# Patient Record
Sex: Female | Born: 2000 | Hispanic: Yes | Marital: Single | Smoking: Never smoker
Health system: Southern US, Community
[De-identification: ages and names within clinical notes are randomized; demographics above are authoritative.]

## PROBLEM LIST (undated history)

## (undated) HISTORY — PX: CHOLECYSTECTOMY: SHX55

---
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
## 2021-10-31 ENCOUNTER — Other Ambulatory Visit: Payer: Self-pay | Admitting: Family Medicine

## 2021-10-31 DIAGNOSIS — N939 Abnormal uterine and vaginal bleeding, unspecified: Secondary | ICD-10-CM

## 2021-11-16 ENCOUNTER — Ambulatory Visit: Payer: Self-pay | Attending: Family Medicine

## 2022-09-21 DIAGNOSIS — Z419 Encounter for procedure for purposes other than remedying health state, unspecified: Secondary | ICD-10-CM | POA: Diagnosis not present

## 2022-10-22 DIAGNOSIS — Z419 Encounter for procedure for purposes other than remedying health state, unspecified: Secondary | ICD-10-CM | POA: Diagnosis not present

## 2022-11-22 DIAGNOSIS — Z419 Encounter for procedure for purposes other than remedying health state, unspecified: Secondary | ICD-10-CM | POA: Diagnosis not present

## 2022-12-21 DIAGNOSIS — Z419 Encounter for procedure for purposes other than remedying health state, unspecified: Secondary | ICD-10-CM | POA: Diagnosis not present

## 2023-01-08 ENCOUNTER — Other Ambulatory Visit: Payer: Self-pay | Admitting: Family Medicine

## 2023-01-08 DIAGNOSIS — R1011 Right upper quadrant pain: Secondary | ICD-10-CM

## 2023-01-08 DIAGNOSIS — R519 Headache, unspecified: Secondary | ICD-10-CM

## 2023-01-08 DIAGNOSIS — R110 Nausea: Secondary | ICD-10-CM

## 2023-01-08 DIAGNOSIS — R197 Diarrhea, unspecified: Secondary | ICD-10-CM

## 2023-01-09 ENCOUNTER — Ambulatory Visit
Admission: RE | Admit: 2023-01-09 | Discharge: 2023-01-09 | Disposition: A | Discharge status: Home / Self Care | Payer: Managed Care, Other (non HMO) | Source: Ambulatory Visit | Attending: Family Medicine | Admitting: Family Medicine

## 2023-01-09 DIAGNOSIS — R197 Diarrhea, unspecified: Secondary | ICD-10-CM | POA: Insufficient documentation

## 2023-01-09 DIAGNOSIS — R110 Nausea: Secondary | ICD-10-CM | POA: Diagnosis present

## 2023-01-09 DIAGNOSIS — R1011 Right upper quadrant pain: Secondary | ICD-10-CM | POA: Diagnosis present

## 2023-01-16 ENCOUNTER — Inpatient Hospital Stay: Admission: RE | Admit: 2023-01-16 | Payer: Medicaid Other | Source: Ambulatory Visit

## 2023-01-22 ENCOUNTER — Other Ambulatory Visit: Payer: Medicaid Other

## 2023-05-31 ENCOUNTER — Other Ambulatory Visit: Payer: Self-pay

## 2023-05-31 MED ORDER — PANTOPRAZOLE SODIUM 40 MG PO TBEC
40.0000 mg | DELAYED_RELEASE_TABLET | Freq: Every day | ORAL | 11 refills | Status: DC
  Filled 2023-05-31 (×3): qty 30, 30d supply, fill #0

## 2023-05-31 MED ORDER — HYOSCYAMINE SULFATE 0.125 MG SL SUBL
0.1250 mg | SUBLINGUAL_TABLET | Freq: Four times a day (QID) | SUBLINGUAL | 1 refills | Status: DC | PRN
  Filled 2023-05-31: qty 30, 8d supply, fill #0

## 2023-06-02 ENCOUNTER — Other Ambulatory Visit: Payer: Self-pay

## 2023-06-11 ENCOUNTER — Encounter: Payer: Self-pay | Admitting: Gastroenterology

## 2023-06-11 ENCOUNTER — Other Ambulatory Visit: Payer: Self-pay | Admitting: Gastroenterology

## 2023-06-11 ENCOUNTER — Ambulatory Visit
Admission: RE | Admit: 2023-06-11 | Discharge: 2023-06-11 | Disposition: A | Discharge status: Home / Self Care | Payer: Managed Care, Other (non HMO) | Source: Ambulatory Visit | Attending: Gastroenterology | Admitting: Gastroenterology

## 2023-06-11 DIAGNOSIS — R1084 Generalized abdominal pain: Secondary | ICD-10-CM | POA: Insufficient documentation

## 2023-06-11 MED ORDER — IOHEXOL 300 MG/ML  SOLN
100.0000 mL | Freq: Once | INTRAMUSCULAR | Status: AC | PRN
  Administered 2023-06-11: 100 mL via INTRAVENOUS

## 2023-06-13 ENCOUNTER — Other Ambulatory Visit: Payer: Self-pay | Admitting: Family Medicine

## 2023-06-13 DIAGNOSIS — R110 Nausea: Secondary | ICD-10-CM

## 2023-06-13 DIAGNOSIS — R112 Nausea with vomiting, unspecified: Secondary | ICD-10-CM

## 2023-06-13 DIAGNOSIS — R197 Diarrhea, unspecified: Secondary | ICD-10-CM

## 2023-06-13 DIAGNOSIS — R1013 Epigastric pain: Secondary | ICD-10-CM

## 2023-06-18 ENCOUNTER — Other Ambulatory Visit: Payer: Self-pay

## 2023-06-18 MED ORDER — ZEPBOUND 5 MG/0.5ML ~~LOC~~ SOAJ
5.0000 mg | SUBCUTANEOUS | 3 refills | Status: DC
Start: 2023-06-18 — End: 2023-07-11
  Filled 2023-06-18 – 2023-06-25 (×3): qty 2, 28d supply, fill #0

## 2023-06-19 ENCOUNTER — Other Ambulatory Visit: Payer: Self-pay

## 2023-06-19 MED ORDER — AZITHROMYCIN 250 MG PO TABS
ORAL_TABLET | ORAL | 0 refills | Status: DC
  Filled 2023-06-19: qty 6, 5d supply, fill #0

## 2023-06-19 MED ORDER — PAXLOVID (300/100) 20 X 150 MG & 10 X 100MG PO TBPK
ORAL_TABLET | ORAL | 0 refills | Status: DC
  Filled 2023-06-19: qty 30, 5d supply, fill #0
  Filled 2023-06-19: qty 30, 30d supply, fill #0
  Filled 2023-06-19: qty 30, 5d supply, fill #0

## 2023-06-19 MED ORDER — PREDNISONE 20 MG PO TABS
20.0000 mg | ORAL_TABLET | Freq: Two times a day (BID) | ORAL | 0 refills | Status: DC
  Filled 2023-06-19: qty 10, 5d supply, fill #0

## 2023-06-25 ENCOUNTER — Other Ambulatory Visit: Payer: Self-pay

## 2023-08-09 ENCOUNTER — Encounter: Payer: Self-pay | Admitting: Emergency Medicine

## 2023-08-09 ENCOUNTER — Ambulatory Visit
Admission: EM | Admit: 2023-08-09 | Discharge: 2023-08-09 | Disposition: A | Discharge status: Home / Self Care | Payer: Managed Care, Other (non HMO) | Attending: Internal Medicine | Admitting: Internal Medicine

## 2023-08-09 DIAGNOSIS — N898 Other specified noninflammatory disorders of vagina: Secondary | ICD-10-CM

## 2023-08-09 DIAGNOSIS — T50Z95A Adverse effect of other vaccines and biological substances, initial encounter: Secondary | ICD-10-CM | POA: Diagnosis present

## 2023-08-09 DIAGNOSIS — Z1152 Encounter for screening for COVID-19: Secondary | ICD-10-CM | POA: Diagnosis not present

## 2023-08-09 DIAGNOSIS — R52 Pain, unspecified: Secondary | ICD-10-CM | POA: Insufficient documentation

## 2023-08-09 LAB — URINALYSIS, W/ REFLEX TO CULTURE (INFECTION SUSPECTED)
Bilirubin Urine: NEGATIVE
Glucose, UA: NEGATIVE mg/dL
Hgb urine dipstick: NEGATIVE
Ketones, ur: NEGATIVE mg/dL
Leukocytes,Ua: NEGATIVE
Nitrite: NEGATIVE
Protein, ur: NEGATIVE mg/dL
RBC / HPF: NONE SEEN RBC/hpf (ref 0–5)
Specific Gravity, Urine: 1.025 (ref 1.005–1.030)
WBC, UA: NONE SEEN WBC/hpf (ref 0–5)
pH: 6 (ref 5.0–8.0)

## 2023-08-09 LAB — RESP PANEL BY RT-PCR (FLU A&B, COVID) ARPGX2
Influenza A by PCR: NEGATIVE
Influenza B by PCR: NEGATIVE
SARS Coronavirus 2 by RT PCR: NEGATIVE

## 2023-08-09 LAB — WET PREP, GENITAL
Clue Cells Wet Prep HPF POC: NONE SEEN
Sperm: NONE SEEN
Trich, Wet Prep: NONE SEEN
WBC, Wet Prep HPF POC: >10 — AB (ref <10)
Yeast Wet Prep HPF POC: NONE SEEN

## 2023-08-09 NOTE — Discharge Instructions (Signed)
Please follow-up with your gynecologist for further workup of your symptoms.  You may take Tylenol or ibuprofen as needed for body aches.  Lots of rest and fluids.  Follow-up with your PCP if your symptoms do not improve.  Please go to the ER for any worsening symptoms.  I hope you feel better soon!

## 2023-08-09 NOTE — ED Provider Notes (Signed)
[facility]    CSN: [id number] Arrival date & time: 08/09/23  0827      History   Chief Complaint Chief Complaint  Patient presents with   Generalized Body Aches   Chills   Vaginal Discharge    HPI Kathleen Mack is a 22 y.o. female  presents for evaluation of URI symptoms for 1 days. Patient reports associated symptoms of headache, body aches, chills. Denies N/V/D, fevers, ear pain, cough, sore throat, shortness of breath. Patient does not have a hx of asthma. Patient does not have a history of smoking.  Reports no sick contacts.  Symptoms began after getting the flu shot yesterday.  She states she is never had this reaction to flu shot in the past.  In addition she reports 1 week of a vaginal discharge that is neither malodorous nor pruritic.  States she has had this type of discharge in the past and was told it was BV but denies any other symptoms.  No dysuria, flank pain, STD concern or exposure.  Pt has taken Tylenol OTC for symptoms. Pt has no other concerns at this time.    Vaginal Discharge   No past medical history on file.  There are no problems to display for this patient.   No past surgical history on file.  OB History   No obstetric history on file.      Home Medications    Prior to Admission medications   Medication Sig Start Date End Date Taking? Authorizing Provider  azithromycin (ZITHROMAX) 250 MG tablet Take 2 tablets (500mg ) by mouth on Day 1, then take 1 tablet (250mg ) by mouth on Days 2-5. 06/19/23     hyoscyamine (LEVSIN SL) 0.125 MG SL tablet Place 1 tablet (0.125 mg total) under the tongue every 6 (six) hours as needed for cramping. 05/31/23     nirmatrelvir & ritonavir (PAXLOVID, 300/100,) 20 x 150 MG & 10 x 100MG  TBPK Take 2 (two) pink tablets (300 mg nirmatrelvir) with 1 (one) tablet (100 mg ritonavir) by mouth twice a day for 5 days. All 3 (three) tablets to be taken together every morning and evening. 06/19/23     ondansetron (ZOFRAN  ODT) 4 MG disintegrating tablet Take 1 tablet (4 mg total) by mouth every 8 (eight) hours as needed for nausea or vomiting. 08/10/15   Minna Antis, MD  pantoprazole (PROTONIX) 40 MG tablet Take 1 tablet (40 mg total) by mouth daily. 05/31/23     phenazopyridine (PYRIDIUM) 200 MG tablet Take 1 tablet (200 mg total) by mouth 3 (three) times daily as needed for pain. 05/24/17   Joni Reining, PA-C  predniSONE (DELTASONE) 20 MG tablet Take 1 tablet (20 mg total) by mouth 2 (two) times daily for 5 days. 06/19/23     ranitidine (ZANTAC) 150 MG tablet Take 150 mg by mouth daily. For 14 days    [provider]    Family History No family history on file.  Social History Social History   Tobacco Use   Smoking status: Never   Smokeless tobacco: Never  Vaping Use   Vaping status: Never Used  Substance Use Topics   Alcohol use: No     Allergies   Patient has no known allergies.   Review of Systems Review of Systems  Constitutional:  Positive for chills.  Genitourinary:  Positive for vaginal discharge.  Musculoskeletal:  Positive for myalgias.  Neurological:  Positive for headaches.     Physical Exam Triage Vital Signs  ED Triage Vitals [08/09/23 0907]  Encounter Vitals Group     BP 121/67     Systolic BP Percentile      Diastolic BP Percentile      Pulse Rate 99     Resp 14     Temp 98.1 F (36.7 C)     Temp Source Oral     SpO2 100 %     Weight      Height      Head Circumference      Peak Flow      Pain Score      Pain Loc      Pain Education      Exclude from Growth Chart    No data found.  Updated Vital Signs BP 121/67 (BP Location: Left Arm)   Pulse 99   Temp 98.1 F (36.7 C) (Oral)   Resp 14   LMP 08/01/2023 (Exact Date)   SpO2 100%   Visual Acuity Right Eye Distance:   Left Eye Distance:   Bilateral Distance:    Right Eye Near:   Left Eye Near:    Bilateral Near:     Physical Exam Vitals and nursing note reviewed.  Constitutional:       General: She is not in acute distress.    Appearance: She is well-developed. She is not ill-appearing.  HENT:     Head: Normocephalic and atraumatic.     Right Ear: Tympanic membrane and ear canal normal.     Left Ear: Tympanic membrane and ear canal normal.     Nose: No congestion or rhinorrhea.     Mouth/Throat:     Mouth: Mucous membranes are moist.     Pharynx: Oropharynx is clear. Uvula midline. No posterior oropharyngeal erythema.     Tonsils: No tonsillar exudate or tonsillar abscesses.  Eyes:     Conjunctiva/sclera: Conjunctivae normal.     Pupils: Pupils are equal, round, and reactive to light.  Cardiovascular:     Rate and Rhythm: Normal rate and regular rhythm.     Heart sounds: Normal heart sounds.  Pulmonary:     Effort: Pulmonary effort is normal.     Breath sounds: Normal breath sounds.  Musculoskeletal:     Cervical back: Normal range of motion and neck supple.  Lymphadenopathy:     Cervical: No cervical adenopathy.  Skin:    General: Skin is warm and dry.  Neurological:     General: No focal deficit present.     Mental Status: She is alert and oriented to person, place, and time.  Psychiatric:        Mood and Affect: Mood normal.        Behavior: Behavior normal.      UC Treatments / Results  Labs (all labs ordered are listed, but only abnormal results are displayed) Labs Reviewed  WET PREP, GENITAL - Abnormal; Notable for the following components:      Result Value   WBC, Wet Prep HPF POC >10 (*)    All other components within normal limits  URINALYSIS, W/ REFLEX TO CULTURE (INFECTION SUSPECTED) - Abnormal; Notable for the following components:   Bacteria, UA RARE (*)    All other components within normal limits  RESP PANEL BY RT-PCR (FLU A&B, COVID) [id number]    EKG   Radiology No results found.  Procedures Procedures (including critical care time)  Medications Ordered in UC Medications - No data to display  Initial Impression /  Assessment  and Plan / UC Course  I have reviewed the triage vital signs and the nursing notes.  Pertinent labs & imaging results that were available during my care of the patient were reviewed by me and considered in my medical decision making (see chart for details).     Reviewed exam and symptoms with patient.  No red flags.  UA negative for UTI.  Wet prep negative for BV, yeast, trichomonas.  Patient declines any STD testing at this time.  States vaginal discharge just more than normal but otherwise not bothersome.  Advised to follow-up with her gynecologist if symptoms persist.  Discussed body aches and chills are secondary to her immune response to the vaccine.  She has a negative COVID and flu PCR.  Advised OTC analgesics and rest for this.  PCP follow-up as symptoms do not improve.  ER precautions reviewed. Final Clinical Impressions(s) / UC Diagnoses   Final diagnoses:  Body aches after vaccination  Vaginal discharge     Discharge Instructions      Please follow-up with your gynecologist for further workup of your symptoms.  You may take Tylenol or ibuprofen as needed for body aches.  Lots of rest and fluids.  Follow-up with your PCP if your symptoms do not improve.  Please go to the ER for any worsening symptoms.  I hope you feel better soon!     ED Prescriptions   None    PDMP not reviewed this encounter.   Radford Pax, NP 08/09/23 1015

## 2023-08-09 NOTE — ED Triage Notes (Addendum)
Patient states that she got her flu vaccine yesterday.  Patient states that last night she started having bodyaches, nausea and chills.  Patient denies fevers.  Patient also reports vaginal discharge for a week.  Patient has history of BV.  Patient would like a work note

## 2023-08-12 NOTE — Plan of Care (Signed)
[facility] Tonsillectomy/Adenoidectomy, Postoperative PEDS care plan entered in error.

## 2023-08-23 ENCOUNTER — Other Ambulatory Visit: Payer: Self-pay

## 2023-08-23 MED ORDER — ESCITALOPRAM OXALATE 5 MG PO TABS
5.0000 mg | ORAL_TABLET | Freq: Every day | ORAL | 0 refills | Status: DC
  Filled 2023-08-23: qty 30, 30d supply, fill #0

## 2023-09-09 ENCOUNTER — Other Ambulatory Visit: Payer: Self-pay

## 2023-09-09 MED ORDER — PROMETHAZINE HCL 12.5 MG PO TABS
12.5000 mg | ORAL_TABLET | Freq: Three times a day (TID) | ORAL | 0 refills | Status: DC | PRN
  Filled 2023-09-09: qty 20, 7d supply, fill #0

## 2023-09-09 MED ORDER — CEFDINIR 300 MG PO CAPS
300.0000 mg | ORAL_CAPSULE | Freq: Two times a day (BID) | ORAL | 0 refills | Status: DC
  Filled 2023-09-09: qty 14, 7d supply, fill #0

## 2023-09-09 MED ORDER — OMEPRAZOLE 20 MG PO CPDR
20.0000 mg | DELAYED_RELEASE_CAPSULE | Freq: Every day | ORAL | 0 refills | Status: AC
  Filled 2023-09-09: qty 14, 14d supply, fill #0

## 2023-09-11 ENCOUNTER — Encounter: Payer: Self-pay | Admitting: Family Medicine

## 2023-09-13 ENCOUNTER — Other Ambulatory Visit: Payer: Self-pay

## 2023-09-13 MED ORDER — PHENTERMINE HCL 37.5 MG PO TABS
37.5000 mg | ORAL_TABLET | Freq: Every morning | ORAL | 2 refills | Status: DC
  Filled 2023-09-13: qty 30, 30d supply, fill #0
  Filled 2023-10-11: qty 30, 30d supply, fill #1
  Filled 2023-11-12: qty 30, 30d supply, fill #2

## 2023-09-13 MED ORDER — NORETHIN ACE-ETH ESTRAD-FE 1-20 MG-MCG PO TABS
1.0000 | ORAL_TABLET | Freq: Every day | ORAL | 3 refills | Status: DC
  Filled 2023-09-13: qty 84, 84d supply, fill #0

## 2023-09-17 ENCOUNTER — Ambulatory Visit
Admission: RE | Admit: 2023-09-17 | Discharge: 2023-09-17 | Disposition: A | Discharge status: Home / Self Care | Payer: Managed Care, Other (non HMO) | Source: Ambulatory Visit | Attending: Family Medicine | Admitting: Family Medicine

## 2023-09-17 DIAGNOSIS — R110 Nausea: Secondary | ICD-10-CM

## 2023-09-17 DIAGNOSIS — R519 Headache, unspecified: Secondary | ICD-10-CM

## 2023-09-17 MED ORDER — GADOPICLENOL 0.5 MMOL/ML IV SOLN
10.0000 mL | Freq: Once | INTRAVENOUS | Status: AC | PRN
  Administered 2023-09-17: 9 mL via INTRAVENOUS

## 2023-09-23 ENCOUNTER — Other Ambulatory Visit: Payer: Self-pay

## 2023-09-23 MED ORDER — ESCITALOPRAM OXALATE 5 MG PO TABS
5.0000 mg | ORAL_TABLET | Freq: Every day | ORAL | 1 refills | Status: DC
  Filled 2023-09-23: qty 90, 90d supply, fill #0

## 2023-09-24 ENCOUNTER — Other Ambulatory Visit: Payer: Self-pay

## 2023-10-08 ENCOUNTER — Other Ambulatory Visit: Payer: Self-pay

## 2023-10-08 MED ORDER — NORGESTIMATE-ETH ESTRADIOL 0.25-35 MG-MCG PO TABS
1.0000 | ORAL_TABLET | Freq: Every day | ORAL | 3 refills | Status: DC
  Filled 2023-10-08: qty 84, 84d supply, fill #0

## 2023-10-10 ENCOUNTER — Other Ambulatory Visit: Payer: Self-pay

## 2023-10-10 MED ORDER — OSELTAMIVIR PHOSPHATE 75 MG PO CAPS
75.0000 mg | ORAL_CAPSULE | Freq: Two times a day (BID) | ORAL | 0 refills | Status: DC
  Filled 2023-10-10: qty 10, 5d supply, fill #0

## 2023-10-11 ENCOUNTER — Other Ambulatory Visit: Payer: Self-pay

## 2023-11-11 ENCOUNTER — Ambulatory Visit
Admission: EM | Admit: 2023-11-11 | Discharge: 2023-11-11 | Disposition: A | Discharge status: Home / Self Care | Payer: Managed Care, Other (non HMO) | Attending: Emergency Medicine | Admitting: Emergency Medicine

## 2023-11-11 DIAGNOSIS — R300 Dysuria: Secondary | ICD-10-CM | POA: Insufficient documentation

## 2023-11-11 DIAGNOSIS — N898 Other specified noninflammatory disorders of vagina: Secondary | ICD-10-CM | POA: Insufficient documentation

## 2023-11-11 DIAGNOSIS — R8290 Unspecified abnormal findings in urine: Secondary | ICD-10-CM | POA: Diagnosis present

## 2023-11-11 LAB — POCT URINALYSIS DIP (MANUAL ENTRY)
Bilirubin, UA: NEGATIVE
Blood, UA: NEGATIVE
Glucose, UA: NEGATIVE mg/dL
Ketones, POC UA: NEGATIVE mg/dL
Nitrite, UA: NEGATIVE
Protein Ur, POC: NEGATIVE mg/dL
Spec Grav, UA: 1.02 (ref 1.010–1.025)
Urobilinogen, UA: >=8 U/dL — AB
pH, UA: 7 (ref 5.0–8.0)

## 2023-11-11 LAB — POCT URINE PREGNANCY: Preg Test, Ur: NEGATIVE

## 2023-11-11 MED ORDER — METRONIDAZOLE 500 MG PO TABS: 500.0000 mg | ORAL_TABLET | Freq: Two times a day (BID) | ORAL | 0 refills | Status: AC

## 2023-11-11 NOTE — ED Triage Notes (Signed)
Patient to Urgent Care with complaints of vaginal discharge/ dysuria/ dark urine.   Symptoms started yesterday.  Denies any concerns for STDs but would like to rule this out.

## 2023-11-11 NOTE — Discharge Instructions (Addendum)
Your urine is positive for urobilinogen.  Please have this rechecked by your primary care provider.    Your vaginal tests are pending.  If your test results are positive, we will call you.  You and your sexual partner(s) may require treatment at that time.  Do not have sexual activity for at least 7 days.    Take the metronidazole as directed.  Stop this medication if your vaginal tests are negative for bacterial vaginitis.

## 2023-11-11 NOTE — ED Provider Notes (Signed)
[facility]    CSN: [id number] Arrival date & time: 11/11/23  1747      History   Chief Complaint Chief Complaint  Patient presents with   Dysuria   Vaginal Discharge    HPI Kathleen Mack is a 23 y.o. female.  Patient presents with dysuria since yesterday.  She states her urine has been dark for a couple of weeks.  Patient also presents with yellow vaginal discharge x 1 day.  She reports history of bacterial vaginitis with similar symptoms.  She requests STD testing as well as testing for BV and yeast.  No OTC medications taken.  No abdominal pain, hematuria, flank pain, pelvic pain.  The history is provided by the patient and medical records.    History reviewed. No pertinent past medical history.  There are no active problems to display for this patient.   Past Surgical History:  Procedure Laterality Date   CHOLECYSTECTOMY      OB History   No obstetric history on file.      Home Medications    Prior to Admission medications   Medication Sig Start Date End Date Taking? Authorizing Provider  metroNIDAZOLE (FLAGYL) 500 MG tablet Take 1 tablet (500 mg total) by mouth 2 (two) times daily. 11/11/23  Yes Mickie Bail, NP  escitalopram (LEXAPRO) 5 MG tablet Take 1 tablet (5 mg total) by mouth daily. Patient not taking: Reported on 11/11/2023 09/23/23     norgestimate-ethinyl estradiol (ORTHO-CYCLEN) 0.25-35 MG-MCG tablet Take 1 tablet by mouth daily. 10/08/23     omeprazole (PRILOSEC) 20 MG capsule Take 1 capsule (20 mg total) by mouth daily for 14 days. 09/09/23     ondansetron (ZOFRAN ODT) 4 MG disintegrating tablet Take 1 tablet (4 mg total) by mouth every 8 (eight) hours as needed for nausea or vomiting. 08/10/15   Minna Antis, MD  phentermine (ADIPEX-P) 37.5 MG tablet Take 1 tablet (37.5 mg total) by mouth every morning before breakfast 09/13/23     ranitidine (ZANTAC) 150 MG tablet Take 150 mg by mouth daily. For 14 days    [provider]   hyoscyamine (LEVSIN SL) 0.125 MG SL tablet Place 1 tablet (0.125 mg total) under the tongue every 6 (six) hours as needed for cramping. 05/31/23 08/23/23    norethindrone-ethinyl estradiol-FE (LOESTRIN FE) 1-20 MG-MCG tablet Take 1 tablet by mouth daily. 09/13/23 10/08/23    pantoprazole (PROTONIX) 40 MG tablet Take 1 tablet (40 mg total) by mouth daily. 05/31/23 08/23/23    promethazine (PHENERGAN) 12.5 MG tablet Take 1 tablet (12.5 mg total) by mouth every 8 (eight) hours as needed for nausea. 09/09/23 09/23/23      Family History History reviewed. No pertinent family history.  Social History Social History   Tobacco Use   Smoking status: Never   Smokeless tobacco: Never  Vaping Use   Vaping status: Never Used  Substance Use Topics   Alcohol use: No   Drug use: Never     Allergies   Patient has no known allergies.   Review of Systems Review of Systems  Constitutional:  Negative for chills and fever.  Gastrointestinal:  Negative for abdominal pain, diarrhea, nausea and vomiting.  Genitourinary:  Positive for dysuria and vaginal discharge. Negative for flank pain, hematuria and pelvic pain.     Physical Exam Triage Vital Signs ED Triage Vitals  Encounter Vitals Group     BP      Systolic BP Percentile  Diastolic BP Percentile      Pulse      Resp      Temp      Temp src      SpO2      Weight      Height      Head Circumference      Peak Flow      Pain Score      Pain Loc      Pain Education      Exclude from Growth Chart    No data found.  Updated Vital Signs BP 106/69   Pulse 90   Temp 98.5 F (36.9 C)   Resp 18   SpO2 98%   Visual Acuity Right Eye Distance:   Left Eye Distance:   Bilateral Distance:    Right Eye Near:   Left Eye Near:    Bilateral Near:     Physical Exam Constitutional:      General: She is not in acute distress. HENT:     Mouth/Throat:     Mouth: Mucous membranes are moist.  Cardiovascular:     Rate and Rhythm:  Normal rate and regular rhythm.  Pulmonary:     Effort: Pulmonary effort is normal. No respiratory distress.  Abdominal:     General: Bowel sounds are normal.     Palpations: Abdomen is soft.     Tenderness: There is no abdominal tenderness. There is no right CVA tenderness, left CVA tenderness, guarding or rebound.  Neurological:     Mental Status: She is alert.      UC Treatments / Results  Labs (all labs ordered are listed, but only abnormal results are displayed) Labs Reviewed  POCT URINALYSIS DIP (MANUAL ENTRY) - Abnormal; Notable for the following components:      Result Value   Urobilinogen, UA >=8.0 (*)    Leukocytes, UA Trace (*)    All other components within normal limits  POCT URINE PREGNANCY  CERVICOVAGINAL ANCILLARY ONLY    EKG   Radiology No results found.  Procedures Procedures (including critical care time)  Medications Ordered in UC Medications - No data to display  Initial Impression / Assessment and Plan / UC Course  I have reviewed the triage vital signs and the nursing notes.  Pertinent labs & imaging results that were available during my care of the patient were reviewed by me and considered in my medical decision making (see chart for details).   Vaginal discharge, dysuria, abnormal urine finding.  Discussed with patient that her urine is positive for urobilinogen and that this needs to be rechecked by her PCP.  Patient obtained vaginal self swab for testing.  Treating with metronidazole.  Discussed that we will call if test results show the need to change the treatment.  Instructed patient to abstain from sexual activity for at least 7 days.  Instructed her to follow-up with her PCP.  Patient agrees to plan of care.    Final Clinical Impressions(s) / UC Diagnoses   Final diagnoses:  Vaginal discharge  Dysuria  Abnormal urine finding     Discharge Instructions      Your urine is positive for urobilinogen.  Please have this rechecked by  your primary care provider.    Your vaginal tests are pending.  If your test results are positive, we will call you.  You and your sexual partner(s) may require treatment at that time.  Do not have sexual activity for at least 7 days.  Take the metronidazole as directed.  Stop this medication if your vaginal tests are negative for bacterial vaginitis.            ED Prescriptions     Medication Sig Dispense Auth. Provider   metroNIDAZOLE (FLAGYL) 500 MG tablet Take 1 tablet (500 mg total) by mouth 2 (two) times daily. 14 tablet Mickie Bail, NP      PDMP not reviewed this encounter.   Mickie Bail, NP 11/11/23 [phone]

## 2023-11-12 ENCOUNTER — Other Ambulatory Visit: Payer: Self-pay

## 2023-11-12 LAB — CERVICOVAGINAL ANCILLARY ONLY
Bacterial Vaginitis (gardnerella): POSITIVE — AB
Candida Glabrata: NEGATIVE
Candida Vaginitis: NEGATIVE
Chlamydia: NEGATIVE
Comment: NEGATIVE
Comment: NEGATIVE
Comment: NEGATIVE
Comment: NEGATIVE
Comment: NEGATIVE
Comment: NORMAL
Neisseria Gonorrhea: NEGATIVE
Trichomonas: NEGATIVE

## 2023-11-28 ENCOUNTER — Other Ambulatory Visit: Payer: Self-pay

## 2023-11-28 MED ORDER — LINZESS 72 MCG PO CAPS
72.0000 ug | ORAL_CAPSULE | Freq: Every day | ORAL | 3 refills | Status: DC
  Filled 2023-11-28: qty 30, 30d supply, fill #0

## 2023-12-02 ENCOUNTER — Other Ambulatory Visit: Payer: Self-pay

## 2023-12-02 MED ORDER — CYCLOBENZAPRINE HCL 5 MG PO TABS
5.0000 mg | ORAL_TABLET | Freq: Three times a day (TID) | ORAL | 0 refills | Status: AC | PRN
  Filled 2023-12-02: qty 21, 7d supply, fill #0

## 2023-12-13 ENCOUNTER — Other Ambulatory Visit: Payer: Self-pay

## 2023-12-13 MED ORDER — PHENTERMINE HCL 37.5 MG PO TABS
37.5000 mg | ORAL_TABLET | Freq: Every day | ORAL | 0 refills | Status: DC
  Filled 2023-12-13: qty 30, 30d supply, fill #0

## 2023-12-13 MED ORDER — MEDROXYPROGESTERONE ACETATE 10 MG PO TABS
10.0000 mg | ORAL_TABLET | Freq: Every day | ORAL | 0 refills | Status: DC
  Filled 2023-12-13: qty 10, 10d supply, fill #0

## 2023-12-24 ENCOUNTER — Other Ambulatory Visit: Payer: Self-pay

## 2023-12-24 MED ORDER — AMPHETAMINE-DEXTROAMPHETAMINE 5 MG PO TABS
5.0000 mg | ORAL_TABLET | Freq: Every morning | ORAL | 0 refills | Status: AC
  Filled 2023-12-24: qty 60, 20d supply, fill #0

## 2024-01-16 ENCOUNTER — Other Ambulatory Visit: Payer: Self-pay

## 2024-01-16 MED ORDER — AMPHETAMINE-DEXTROAMPHETAMINE 10 MG PO TABS
10.0000 mg | ORAL_TABLET | Freq: Every morning | ORAL | 0 refills | Status: AC
  Filled 2024-01-16: qty 60, 20d supply, fill #0

## 2024-01-23 ENCOUNTER — Other Ambulatory Visit: Payer: Self-pay

## 2024-01-23 MED ORDER — IBUPROFEN 800 MG PO TABS
800.0000 mg | ORAL_TABLET | Freq: Four times a day (QID) | ORAL | 0 refills | Status: AC | PRN
  Filled 2024-01-23: qty 30, 8d supply, fill #0

## 2024-02-06 ENCOUNTER — Other Ambulatory Visit: Payer: Self-pay

## 2024-02-06 MED ORDER — BUPROPION HCL 75 MG PO TABS
ORAL_TABLET | ORAL | 2 refills | Status: AC
Start: 2024-02-06 — End: 2024-07-20
  Filled 2024-02-06: qty 55, 32d supply, fill #0

## 2024-02-18 ENCOUNTER — Other Ambulatory Visit: Payer: Self-pay

## 2024-02-18 MED ORDER — AMPHETAMINE-DEXTROAMPHETAMINE 20 MG PO TABS
20.0000 mg | ORAL_TABLET | Freq: Every morning | ORAL | 0 refills | Status: DC
  Filled 2024-02-18: qty 30, 30d supply, fill #0

## 2024-02-26 ENCOUNTER — Other Ambulatory Visit: Payer: Self-pay

## 2024-02-26 MED ORDER — PROPRANOLOL HCL 10 MG PO TABS
10.0000 mg | ORAL_TABLET | Freq: Two times a day (BID) | ORAL | 0 refills | Status: DC
  Filled 2024-02-26: qty 60, 30d supply, fill #0

## 2024-03-06 ENCOUNTER — Other Ambulatory Visit: Payer: Self-pay

## 2024-03-06 MED ORDER — ESCITALOPRAM OXALATE 5 MG PO TABS
ORAL_TABLET | ORAL | 2 refills | Status: DC
  Filled 2024-03-06: qty 60, 37d supply, fill #0

## 2024-03-06 MED ORDER — NORETHINDRONE 0.35 MG PO TABS
1.0000 | ORAL_TABLET | Freq: Every day | ORAL | 1 refills | Status: DC
Start: 2024-03-06 — End: 2024-05-04
  Filled 2024-03-06: qty 84, 84d supply, fill #0

## 2024-03-09 ENCOUNTER — Other Ambulatory Visit: Payer: Self-pay

## 2024-03-09 MED ORDER — LINZESS 145 MCG PO CAPS
145.0000 ug | ORAL_CAPSULE | Freq: Every day | ORAL | 3 refills | Status: DC
Start: 2024-03-09 — End: 2024-07-10
  Filled 2024-03-09: qty 30, 30d supply, fill #0

## 2024-03-19 ENCOUNTER — Other Ambulatory Visit: Payer: Self-pay

## 2024-03-19 MED ORDER — AMPHETAMINE-DEXTROAMPHETAMINE 20 MG PO TABS
20.0000 mg | ORAL_TABLET | Freq: Every morning | ORAL | 0 refills | Status: DC
  Filled 2024-03-19: qty 30, 30d supply, fill #0

## 2024-03-19 MED ORDER — FLUOXETINE HCL 10 MG PO CAPS
ORAL_CAPSULE | ORAL | 0 refills | Status: AC
  Filled 2024-03-19: qty 80, 32d supply, fill #0

## 2024-04-17 ENCOUNTER — Other Ambulatory Visit: Payer: Self-pay

## 2024-04-17 MED ORDER — NITROFURANTOIN MONOHYD MACRO 100 MG PO CAPS
100.0000 mg | ORAL_CAPSULE | Freq: Two times a day (BID) | ORAL | 0 refills | Status: AC
  Filled 2024-04-17: qty 10, 5d supply, fill #0

## 2024-04-21 ENCOUNTER — Other Ambulatory Visit: Payer: Self-pay

## 2024-04-21 MED ORDER — AMPHETAMINE-DEXTROAMPHETAMINE 20 MG PO TABS
20.0000 mg | ORAL_TABLET | Freq: Every morning | ORAL | 0 refills | Status: AC
  Filled 2024-04-21: qty 30, 30d supply, fill #0

## 2024-04-30 ENCOUNTER — Other Ambulatory Visit: Payer: Self-pay

## 2024-04-30 ENCOUNTER — Ambulatory Visit

## 2024-04-30 MED ORDER — ONDANSETRON 4 MG PO TBDP
4.0000 mg | ORAL_TABLET | Freq: Three times a day (TID) | ORAL | 0 refills | Status: DC | PRN
  Filled 2024-04-30: qty 20, 7d supply, fill #0

## 2024-04-30 MED ORDER — CEFDINIR 300 MG PO CAPS
300.0000 mg | ORAL_CAPSULE | Freq: Two times a day (BID) | ORAL | 0 refills | Status: AC
  Filled 2024-04-30: qty 20, 10d supply, fill #0

## 2024-05-05 ENCOUNTER — Other Ambulatory Visit: Payer: Self-pay

## 2024-05-05 MED ORDER — TERCONAZOLE 0.4 % VA CREA
1.0000 | TOPICAL_CREAM | Freq: Every day | VAGINAL | 0 refills | Status: DC
  Filled 2024-05-05: qty 45, 7d supply, fill #0

## 2024-06-20 ENCOUNTER — Other Ambulatory Visit: Payer: Self-pay

## 2024-06-20 ENCOUNTER — Emergency Department
Admission: EM | Admit: 2024-06-20 | Discharge: 2024-06-20 | Discharge status: Against Medical Advice | Attending: Emergency Medicine | Admitting: Emergency Medicine

## 2024-06-20 ENCOUNTER — Encounter: Payer: Self-pay | Admitting: Emergency Medicine

## 2024-06-20 DIAGNOSIS — Z5321 Procedure and treatment not carried out due to patient leaving prior to being seen by health care provider: Secondary | ICD-10-CM | POA: Diagnosis not present

## 2024-06-20 DIAGNOSIS — O26891 Other specified pregnancy related conditions, first trimester: Secondary | ICD-10-CM | POA: Diagnosis present

## 2024-06-20 DIAGNOSIS — R1030 Lower abdominal pain, unspecified: Secondary | ICD-10-CM | POA: Diagnosis not present

## 2024-06-20 DIAGNOSIS — Z3A10 10 weeks gestation of pregnancy: Secondary | ICD-10-CM | POA: Diagnosis not present

## 2024-06-20 DIAGNOSIS — R42 Dizziness and giddiness: Secondary | ICD-10-CM | POA: Diagnosis not present

## 2024-06-20 DIAGNOSIS — R0602 Shortness of breath: Secondary | ICD-10-CM | POA: Insufficient documentation

## 2024-06-20 DIAGNOSIS — O99891 Other specified diseases and conditions complicating pregnancy: Secondary | ICD-10-CM | POA: Diagnosis not present

## 2024-06-20 LAB — URINALYSIS, ROUTINE W REFLEX MICROSCOPIC
Bilirubin Urine: NEGATIVE
Glucose, UA: NEGATIVE mg/dL
Hgb urine dipstick: NEGATIVE
Ketones, ur: NEGATIVE mg/dL
Leukocytes,Ua: NEGATIVE
Nitrite: NEGATIVE
Protein, ur: NEGATIVE mg/dL
Specific Gravity, Urine: 1.004 — ABNORMAL LOW (ref 1.005–1.030)
pH: 7 (ref 5.0–8.0)

## 2024-06-20 LAB — CBC
HCT: 39.6 % (ref 36.0–46.0)
Hemoglobin: 13.5 g/dL (ref 12.0–15.0)
MCH: 29.2 pg (ref 26.0–34.0)
MCHC: 34.1 g/dL (ref 30.0–36.0)
MCV: 85.5 fL (ref 80.0–100.0)
Platelets: 300 K/uL (ref 150–400)
RBC: 4.63 MIL/uL (ref 3.87–5.11)
RDW: 12.2 % (ref 11.5–15.5)
WBC: 10.3 K/uL (ref 4.0–10.5)
nRBC: 0 % (ref 0.0–0.2)

## 2024-06-20 LAB — COMPREHENSIVE METABOLIC PANEL WITH GFR
ALT: 13 U/L (ref 0–44)
AST: 22 U/L (ref 15–41)
Albumin: 3.8 g/dL (ref 3.5–5.0)
Alkaline Phosphatase: 51 U/L (ref 38–126)
Anion gap: 8 (ref 5–15)
BUN: 8 mg/dL (ref 6–20)
CO2: 21 mmol/L — ABNORMAL LOW (ref 22–32)
Calcium: 9.2 mg/dL (ref 8.9–10.3)
Chloride: 105 mmol/L (ref 98–111)
Creatinine, Ser: 0.57 mg/dL (ref 0.44–1.00)
GFR, Estimated: >60 mL/min (ref >60)
Glucose, Bld: 108 mg/dL — ABNORMAL HIGH (ref 70–99)
Potassium: 3.6 mmol/L (ref 3.5–5.1)
Sodium: 134 mmol/L — ABNORMAL LOW (ref 135–145)
Total Bilirubin: 0.4 mg/dL (ref 0.0–1.2)
Total Protein: 7.7 g/dL (ref 6.5–8.1)

## 2024-06-20 LAB — HCG, QUANTITATIVE, PREGNANCY: hCG, Beta Chain, Quant, S: 106028 m[IU]/mL — ABNORMAL HIGH (ref <5)

## 2024-06-20 LAB — LIPASE, BLOOD: Lipase: 42 U/L (ref 11–51)

## 2024-06-20 NOTE — ED Notes (Signed)
 Pt to desk asking about the wait.  Pt educated that we are not allowed to give out wait times. Pt then left the building.

## 2024-06-20 NOTE — ED Triage Notes (Addendum)
 Patient c/o lower abdominal cramping, sob, dizziness and feeling like she is about to pass out since this evening.  Patient is currently [redacted] weeks pregnant.  Patient denies vaginal bleeding, but feels like she's going to. Patient reports prenatal care and US  at 6 weeks.  Patient hyperventilating in triage.

## 2024-07-10 ENCOUNTER — Other Ambulatory Visit: Payer: Self-pay

## 2024-07-10 MED ORDER — TERCONAZOLE 0.4 % VA CREA
TOPICAL_CREAM | VAGINAL | 0 refills | Status: AC
  Filled 2024-07-10: qty 45, 7d supply, fill #0

## 2024-07-10 MED ORDER — BUPROPION HCL ER (XL) 150 MG PO TB24
150.0000 mg | ORAL_TABLET | Freq: Every day | ORAL | 11 refills | Status: DC
  Filled 2024-07-10: qty 30, 30d supply, fill #0

## 2024-07-28 ENCOUNTER — Other Ambulatory Visit
Admission: RE | Admit: 2024-07-28 | Discharge: 2024-07-28 | Disposition: A | Discharge status: Home / Self Care | Source: Ambulatory Visit | Attending: Student | Admitting: Student

## 2024-07-28 DIAGNOSIS — R042 Hemoptysis: Secondary | ICD-10-CM | POA: Diagnosis present

## 2024-07-28 LAB — D-DIMER, QUANTITATIVE: D-Dimer, Quant: 0.43 ug{FEU}/mL (ref 0.00–0.50)

## 2024-07-30 ENCOUNTER — Other Ambulatory Visit: Payer: Self-pay

## 2024-07-30 MED ORDER — DOXYLAMINE-PYRIDOXINE 10-10 MG PO TBEC
2.0000 | DELAYED_RELEASE_TABLET | Freq: Every evening | ORAL | 1 refills | Status: AC
Start: 2024-07-30 — End: ?
  Filled 2024-07-30: qty 120, 60d supply, fill #0

## 2024-08-26 ENCOUNTER — Other Ambulatory Visit: Payer: Self-pay

## 2024-08-26 MED ORDER — BLOOD GLUCOSE MONITOR SYSTEM W/DEVICE KIT
PACK | 0 refills | Status: AC
  Filled 2024-08-26: qty 1, 30d supply, fill #0

## 2024-08-26 MED ORDER — PRECISION QID TEST VI STRP
ORAL_STRIP | 12 refills | Status: AC
  Filled 2024-08-26: qty 100, 25d supply, fill #0
